# Patient Record
Sex: Female | Born: 1985 | Race: White | Hispanic: No | Marital: Single | Smoking: Current every day smoker
Health system: Southern US, Community
[De-identification: ages and names within clinical notes are randomized; demographics above are authoritative.]

## PROBLEM LIST (undated history)

## (undated) DIAGNOSIS — E669 Obesity, unspecified: Secondary | ICD-10-CM

## (undated) DIAGNOSIS — R7303 Prediabetes: Secondary | ICD-10-CM

## (undated) DIAGNOSIS — R945 Abnormal results of liver function studies: Secondary | ICD-10-CM

## (undated) DIAGNOSIS — G4700 Insomnia, unspecified: Secondary | ICD-10-CM

## (undated) DIAGNOSIS — R7989 Other specified abnormal findings of blood chemistry: Secondary | ICD-10-CM

## (undated) DIAGNOSIS — F988 Other specified behavioral and emotional disorders with onset usually occurring in childhood and adolescence: Secondary | ICD-10-CM

## (undated) DIAGNOSIS — I10 Essential (primary) hypertension: Secondary | ICD-10-CM

## (undated) DIAGNOSIS — F319 Bipolar disorder, unspecified: Secondary | ICD-10-CM

## (undated) DIAGNOSIS — F419 Anxiety disorder, unspecified: Secondary | ICD-10-CM

## (undated) HISTORY — DX: Abnormal results of liver function studies: R94.5

## (undated) HISTORY — DX: Prediabetes: R73.03

## (undated) HISTORY — DX: Other specified behavioral and emotional disorders with onset usually occurring in childhood and adolescence: F98.8

## (undated) HISTORY — DX: Insomnia, unspecified: G47.00

## (undated) HISTORY — DX: Essential (primary) hypertension: I10

## (undated) HISTORY — DX: Obesity, unspecified: E66.9

## (undated) HISTORY — DX: Bipolar disorder, unspecified: F31.9

## (undated) HISTORY — DX: Other specified abnormal findings of blood chemistry: R79.89

## (undated) HISTORY — DX: Anxiety disorder, unspecified: F41.9

---
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
## 2006-05-22 HISTORY — PX: CHOLECYSTECTOMY: SHX55

## 2014-05-22 HISTORY — PX: TUBAL LIGATION: SHX77

## 2019-06-13 ENCOUNTER — Encounter: Payer: Self-pay | Admitting: Gastroenterology

## 2019-07-01 ENCOUNTER — Telehealth: Payer: Self-pay | Admitting: Gastroenterology

## 2019-07-01 ENCOUNTER — Other Ambulatory Visit: Payer: Self-pay

## 2019-07-01 ENCOUNTER — Telehealth (INDEPENDENT_AMBULATORY_CARE_PROVIDER_SITE_OTHER): Payer: Medicaid Other | Admitting: Gastroenterology

## 2019-07-01 DIAGNOSIS — R7989 Other specified abnormal findings of blood chemistry: Secondary | ICD-10-CM

## 2019-07-01 DIAGNOSIS — R635 Abnormal weight gain: Secondary | ICD-10-CM

## 2019-07-01 DIAGNOSIS — E785 Hyperlipidemia, unspecified: Secondary | ICD-10-CM

## 2019-07-01 DIAGNOSIS — E119 Type 2 diabetes mellitus without complications: Secondary | ICD-10-CM

## 2019-07-01 DIAGNOSIS — F17200 Nicotine dependence, unspecified, uncomplicated: Secondary | ICD-10-CM

## 2019-07-01 DIAGNOSIS — R945 Abnormal results of liver function studies: Secondary | ICD-10-CM

## 2019-07-01 NOTE — Progress Notes (Signed)
Chief Complaint:   Referring Provider:  Simone Curia, MD      ASSESSMENT AND PLAN;   #1.  Abnormal LFTs likely d/t NAFLD. R/O other causes.  [place] 11/2016: Fatty liver. Neg acute viral hepatitis panel 2018. No ETOH #2.  Associated weight gain, DM2, HLD  Plan: - Check CBC, CMP, PT INR, autoimmune hepatitis panel (AMA, ASMA and anti-LKM), ANA, SPEP, iron studies, serum ceruloplasmin, A1AT, celiac screen and GGT. -Check anti-HAV total Ab and HBsAb.  If negative, would recommend vaccination for hepatitis A and B. -USE -Encouraged her to lose weight. -Avoid high calorie foods including fried foods, foods containing high fructose corn syrup like sodas.  -Start exercising like walking 30 min every day. -FU in 6-8 weeks once above WU is complete.  If unable to lose weight on her own, consider referral to bariatric surgery.    HPI:    Erica Leach is a 34 y.o. female  With history of longstanding abn LFTs. Most recent LFTs from 05/08/2019 AST 231, ALT 332, albumin 4.4.  Normal BUN/creatinine.  Total cholesterol 209, LDL 142.  Previous LFTs from 2018: AST 112, ALT 232. Neg acute hepatitis panel. US-fatty liver.   No history of itching, skin lesions, easy bruisability, intake of over-the-counter medications including diet pills, herbal medications, anabolic steroids or Tylenol.  There is no history of blood transfusions, IV drug use or family history of liver disease.  No jaundice dark urine or pale stools.  No history of alcohol abuse.  Has been progressively gaining weight.  She told me that she had gained 20 pounds over the last 1 month  No nausea, vomiting, heartburn, regurgitation, odynophagia or dysphagia. No melena or hematochezia. No abdominal pain.  Has alternating diarrhea and constipation.  Past Medical History:  Diagnosis Date  . Abnormal LFTs    with US showing fatty liver  . ADD (attention deficit disorder)   . Anxiety   . Bipolar disease, chronic (HCC)   . HTN  (hypertension)   . Insomnia   . Obesity   . Prediabetes     Past Surgical History:  Procedure Laterality Date  . CHOLECYSTECTOMY  2008  . TUBAL LIGATION  2016    Family History  Family history unknown: Yes    Social History   Tobacco Use  . Smoking status: Current Every Day Smoker    Packs/day: 1.00  . Smokeless tobacco: Never Used  Substance Use Topics  . Alcohol use: Not Currently    Comment: socially  . Drug use: Not Currently    Current Outpatient Medications  Medication Sig Dispense Refill  . amLODipine (NORVASC) 5 MG tablet Take 5 mg by mouth daily.    . clonazePAM (KLONOPIN) 0.5 MG tablet 1 tablet daily.    . cyclobenzaprine (FLEXERIL) 5 MG tablet Take 5 mg by mouth as needed for muscle spasms.    . hydrochlorothiazide (HYDRODIURIL) 25 MG tablet Take 25 mg by mouth daily.    . QUEtiapine (SEROQUEL) 50 MG tablet Take 150 mg by mouth daily.     No current facility-administered medications for this visit.    Not on File  Review of Systems:  Constitutional: Denies fever, chills, diaphoresis, appetite change and has fatigue.  HEENT: Denies photophobia, eye pain, redness, hearing loss, ear pain, congestion, sore throat, rhinorrhea, sneezing, mouth sores, neck pain, neck stiffness and tinnitus.   Respiratory: Denies SOB, DOE, cough, chest tightness,  and wheezing.   Cardiovascular: Denies chest pain, palpitations  and leg swelling.  Genitourinary: Denies dysuria, urgency, frequency, hematuria, flank pain and difficulty urinating.  Musculoskeletal: has myalgias, back pain, right knee joint swelling, arthralgias and gait problem.  Skin: No rash.  Neurological: Denies dizziness, seizures, syncope, weakness, light-headedness, numbness and headaches.  Hematological: Denies adenopathy. Easy bruising, personal or family bleeding history  Psychiatric/Behavioral: Has anxiety or depression     Physical Exam:    There were no vitals taken for this visit. Wt Readings from  Last 3 Encounters:  No data found for Wt   Not examined since it was a televisit  I connected with  Erica Leach on 07/01/19 by a telephone (video-did not work)telemedicine application and verified that I am speaking with the correct person using two identifiers.   I discussed the limitations of evaluation and management by telemedicine. The patient expressed understanding and agreed to proceed.   Carmell Austria, MD [facility]

## 2019-07-01 NOTE — Patient Instructions (Signed)
If you are age 36 or older, your body mass index should be between 23-30. Your There is no height or weight on file to calculate BMI. If this is out of the aforementioned range listed, please consider follow up with your Primary Care Provider.  If you are age 26 or younger, your body mass index should be between 19-25. Your There is no height or weight on file to calculate BMI. If this is out of the aformentioned range listed, please consider follow up with your Primary Care Provider.   You have been scheduled for an abdominal ultrasound with elastography at [facility] (1st floor). Your appointment is scheduled for 07/07/19 at 9:30am. Please arrive 15 minutes prior to your scheduled appointment for registration purposes. Make certain not to have anything to eat or drink 6 hours prior to your procedure. Should you need to reschedule your appointment, you may contact radiology at [phone].  Liver Elastography Various chronic liver diseases such as hepatitis B, C, and fatty liver disease can lead to tissue damage and subsequent scar tissue formation. As the scar tissue accumulates, the liver loses some of its elasticity and becomes stiffer. Liver elastography involves the use of a surface ultrasound probe that delivers a low frequency pulse or shear wave to a small volume of liver tissue under the rib cage. The transmission of the sound wave is completely painless. How Is a Liver Elastography Performed? The liver is located in the right upper abdomen under the rib cage. Patients are asked to lie flat on an examination table. A technician places the FibroScan probe between the ribs on the right side of the lower chest wall. A series of 10 painless pulses are then applied to the liver. The results are recorded on the equipment and an overall liver stiffness score is generated. This score is then interpreted by a qualified physician to predict the likelihood of advanced fibrosis or cirrhosis.   Patients are asked to wear loose clothing and should not consume any liquids or solids for a minimum of 4 hours before the test to increase the likelihood of obtaining reliable test results. The scan will take 10 to 15 minutes to complete, but patients should plan on being available for 30 minutes to allow time for preparation   Please go to the lab at [facility] ([address] [place] You will need to go to level "B", you do not need an appointment for this. Hours available are 7:30 am - 4:30 pm.   Follow up 6-8 weeks  Thank you,  Dr. Lynann Bologna

## 2019-07-07 ENCOUNTER — Ambulatory Visit (HOSPITAL_COMMUNITY): Payer: Medicaid Other

## 2019-07-08 ENCOUNTER — Telehealth: Payer: Self-pay | Admitting: Gastroenterology

## 2019-07-08 NOTE — Telephone Encounter (Signed)
Pt requested to reschedule [place].  She stated that she was taking care of her daughter and missed the appt.

## 2019-07-08 NOTE — Telephone Encounter (Signed)
Called and spoke with patient- patient advised of phone number to call [phone] to reschedule her [place] as this office does not capability of scheduling appts for this test; Patient verbalized understanding of information/instructions; Patient advised to call back to the office at [phone] should questions/concerns arise;

## 2019-07-16 ENCOUNTER — Other Ambulatory Visit: Payer: Self-pay

## 2019-07-16 ENCOUNTER — Ambulatory Visit (HOSPITAL_COMMUNITY)
Admission: RE | Admit: 2019-07-16 | Discharge: 2019-07-16 | Disposition: A | Discharge status: Home / Self Care | Payer: Medicaid Other | Source: Ambulatory Visit | Attending: Gastroenterology | Admitting: Gastroenterology

## 2019-07-16 DIAGNOSIS — R7989 Other specified abnormal findings of blood chemistry: Secondary | ICD-10-CM

## 2019-07-16 DIAGNOSIS — R945 Abnormal results of liver function studies: Secondary | ICD-10-CM | POA: Diagnosis present

## 2019-07-18 ENCOUNTER — Other Ambulatory Visit (INDEPENDENT_AMBULATORY_CARE_PROVIDER_SITE_OTHER): Payer: Medicaid Other

## 2019-07-18 DIAGNOSIS — R7989 Other specified abnormal findings of blood chemistry: Secondary | ICD-10-CM

## 2019-07-18 DIAGNOSIS — R945 Abnormal results of liver function studies: Secondary | ICD-10-CM | POA: Diagnosis not present

## 2019-07-18 LAB — CBC WITH DIFFERENTIAL/PLATELET
Basophils Absolute: 0.1 10*3/uL (ref 0.0–0.1)
Basophils Relative: 1.5 % (ref 0.0–3.0)
Eosinophils Absolute: 0.2 10*3/uL (ref 0.0–0.7)
Eosinophils Relative: 2.2 % (ref 0.0–5.0)
HCT: 44.7 % (ref 36.0–46.0)
Hemoglobin: 15.6 g/dL — ABNORMAL HIGH (ref 12.0–15.0)
Lymphocytes Relative: 27.8 % (ref 12.0–46.0)
Lymphs Abs: 2.7 10*3/uL (ref 0.7–4.0)
MCHC: 34.8 g/dL (ref 30.0–36.0)
MCV: 93.1 fl (ref 78.0–100.0)
Monocytes Absolute: 0.7 10*3/uL (ref 0.1–1.0)
Monocytes Relative: 7.3 % (ref 3.0–12.0)
Neutro Abs: 5.9 10*3/uL (ref 1.4–7.7)
Neutrophils Relative %: 61.2 % (ref 43.0–77.0)
Platelets: 238 10*3/uL (ref 150.0–400.0)
RBC: 4.8 Mil/uL (ref 3.87–5.11)
RDW: 13.9 % (ref 11.5–15.5)
WBC: 9.6 10*3/uL (ref 4.0–10.5)

## 2019-07-18 LAB — COMPREHENSIVE METABOLIC PANEL
ALT: 141 U/L — ABNORMAL HIGH (ref 0–35)
AST: 68 U/L — ABNORMAL HIGH (ref 0–37)
Albumin: 4.5 g/dL (ref 3.5–5.2)
Alkaline Phosphatase: 72 U/L (ref 39–117)
BUN: 8 mg/dL (ref 6–23)
CO2: 25 mEq/L (ref 19–32)
Calcium: 9.5 mg/dL (ref 8.4–10.5)
Chloride: 107 mEq/L (ref 96–112)
Creatinine, Ser: 0.59 mg/dL (ref 0.40–1.20)
GFR: 115.71 mL/min (ref >60.00)
Glucose, Bld: 91 mg/dL (ref 70–99)
Potassium: 4.2 mEq/L (ref 3.5–5.1)
Sodium: 138 mEq/L (ref 135–145)
Total Bilirubin: 0.6 mg/dL (ref 0.2–1.2)
Total Protein: 7.3 g/dL (ref 6.0–8.3)

## 2019-07-18 LAB — PROTIME-INR
INR: 1 ratio (ref 0.8–1.0)
Prothrombin Time: 11.2 s (ref 9.6–13.1)

## 2019-07-18 LAB — GAMMA GT: GGT: 52 U/L — ABNORMAL HIGH (ref 7–51)

## 2019-07-22 ENCOUNTER — Telehealth: Payer: Self-pay | Admitting: Gastroenterology

## 2019-07-22 LAB — CELIAC PANEL 10
Antigliadin Abs, IgA: 4 units (ref 0–19)
Endomysial IgA: NEGATIVE
Gliadin IgG: 2 units (ref 0–19)
IgA/Immunoglobulin A, Serum: 145 mg/dL (ref 87–352)
Tissue Transglut Ab: <2 U/mL (ref 0–5)
Transglutaminase IgA: <2 U/mL (ref 0–3)

## 2019-07-22 NOTE — Telephone Encounter (Signed)
Patient returned your call about results, please call patient one more time.   

## 2019-07-22 NOTE — Telephone Encounter (Signed)
Please see result note for further documentation concerning this patient

## 2019-07-23 LAB — HEPATITIS B SURFACE ANTIBODY,QUALITATIVE: Hep B S Ab: REACTIVE — AB

## 2019-07-23 LAB — CERULOPLASMIN: Ceruloplasmin: 29 mg/dL (ref 18–53)

## 2019-07-23 LAB — PROTEIN ELECTROPHORESIS, SERUM
Albumin ELP: 4.6 g/dL (ref 3.8–4.8)
Alpha 1: 0.3 g/dL (ref 0.2–0.3)
Alpha 2: 0.6 g/dL (ref 0.5–0.9)
Beta 2: 0.4 g/dL (ref 0.2–0.5)
Beta Globulin: 0.5 g/dL (ref 0.4–0.6)
Gamma Globulin: 0.8 g/dL (ref 0.8–1.7)
Total Protein: 7.2 g/dL (ref 6.1–8.1)

## 2019-07-23 LAB — IRON, TOTAL/TOTAL IRON BINDING CAP
%SAT: 26 % (calc) (ref 16–45)
Iron: 100 ug/dL (ref 40–190)
TIBC: 380 mcg/dL (calc) (ref 250–450)

## 2019-07-23 LAB — ALPHA-1-ANTITRYPSIN: A-1 Antitrypsin, Ser: 116 mg/dL (ref 83–199)

## 2019-07-23 LAB — MITOCHONDRIAL ANTIBODIES: Mitochondrial M2 Ab, IgG: <=20 U

## 2019-07-23 LAB — ANA: Anti Nuclear Antibody (ANA): NEGATIVE

## 2019-07-23 LAB — HEPATITIS A ANTIBODY, TOTAL: Hepatitis A AB,Total: NONREACTIVE

## 2019-07-23 LAB — ANTI-MICROSOMAL ANTIBODY LIVER / KIDNEY: LKM1 Ab: <=20 U (ref <=20.0)

## 2019-08-13 ENCOUNTER — Ambulatory Visit: Payer: Medicaid Other | Admitting: Gastroenterology

## 2020-11-21 ENCOUNTER — Emergency Department (HOSPITAL_COMMUNITY)
Admission: EM | Admit: 2020-11-21 | Discharge: 2020-11-22 | Disposition: A | Discharge status: Home / Self Care | Payer: Medicaid Other | Attending: Emergency Medicine | Admitting: Emergency Medicine

## 2020-11-21 ENCOUNTER — Emergency Department (HOSPITAL_BASED_OUTPATIENT_CLINIC_OR_DEPARTMENT_OTHER): Payer: Medicaid Other

## 2020-11-21 ENCOUNTER — Emergency Department (HOSPITAL_COMMUNITY): Payer: Medicaid Other

## 2020-11-21 DIAGNOSIS — I10 Essential (primary) hypertension: Secondary | ICD-10-CM | POA: Diagnosis not present

## 2020-11-21 DIAGNOSIS — Y900 Blood alcohol level of less than 20 mg/100 ml: Secondary | ICD-10-CM | POA: Diagnosis not present

## 2020-11-21 DIAGNOSIS — R413 Other amnesia: Secondary | ICD-10-CM | POA: Insufficient documentation

## 2020-11-21 DIAGNOSIS — Z79899 Other long term (current) drug therapy: Secondary | ICD-10-CM | POA: Diagnosis not present

## 2020-11-21 DIAGNOSIS — F17210 Nicotine dependence, cigarettes, uncomplicated: Secondary | ICD-10-CM | POA: Diagnosis not present

## 2020-11-21 LAB — CBC WITH DIFFERENTIAL/PLATELET
Abs Immature Granulocytes: 0.04 10*3/uL (ref 0.00–0.07)
Basophils Absolute: 0.1 10*3/uL (ref 0.0–0.1)
Basophils Relative: 1 %
Eosinophils Absolute: 0 10*3/uL (ref 0.0–0.5)
Eosinophils Relative: 0 %
HCT: 42.8 % (ref 36.0–46.0)
Hemoglobin: 14.2 g/dL (ref 12.0–15.0)
Immature Granulocytes: 0 %
Lymphocytes Relative: 21 %
Lymphs Abs: 2.3 10*3/uL (ref 0.7–4.0)
MCH: 32.5 pg (ref 26.0–34.0)
MCHC: 33.2 g/dL (ref 30.0–36.0)
MCV: 97.9 fL (ref 80.0–100.0)
Monocytes Absolute: 0.9 10*3/uL (ref 0.1–1.0)
Monocytes Relative: 8 %
Neutro Abs: 7.4 10*3/uL (ref 1.7–7.7)
Neutrophils Relative %: 70 %
Platelets: 284 10*3/uL (ref 150–400)
RBC: 4.37 MIL/uL (ref 3.87–5.11)
RDW: 13.2 % (ref 11.5–15.5)
WBC: 10.6 10*3/uL — ABNORMAL HIGH (ref 4.0–10.5)
nRBC: 0 % (ref 0.0–0.2)

## 2020-11-21 LAB — COMPREHENSIVE METABOLIC PANEL
ALT: 17 U/L (ref 0–44)
AST: 16 U/L (ref 15–41)
Albumin: 3.8 g/dL (ref 3.5–5.0)
Alkaline Phosphatase: 52 U/L (ref 38–126)
Anion gap: 7 (ref 5–15)
BUN: 8 mg/dL (ref 6–20)
CO2: 23 mmol/L (ref 22–32)
Calcium: 9.4 mg/dL (ref 8.9–10.3)
Chloride: 107 mmol/L (ref 98–111)
Creatinine, Ser: 0.6 mg/dL (ref 0.44–1.00)
GFR, Estimated: >60 mL/min (ref >60)
Glucose, Bld: 90 mg/dL (ref 70–99)
Potassium: 3.6 mmol/L (ref 3.5–5.1)
Sodium: 137 mmol/L (ref 135–145)
Total Bilirubin: 1 mg/dL (ref 0.3–1.2)
Total Protein: 6.7 g/dL (ref 6.5–8.1)

## 2020-11-21 LAB — I-STAT BETA HCG BLOOD, ED (MC, WL, AP ONLY): I-stat hCG, quantitative: <5 m[IU]/mL (ref <5)

## 2020-11-21 LAB — ETHANOL: Alcohol, Ethyl (B): <10 mg/dL (ref <10)
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
## 2020-11-22 LAB — RAPID URINE DRUG SCREEN, HOSP PERFORMED
Amphetamines: NOT DETECTED
Barbiturates: NOT DETECTED
Benzodiazepines: POSITIVE — AB
Cocaine: NOT DETECTED
Opiates: NOT DETECTED
Tetrahydrocannabinol: NOT DETECTED

## 2020-11-22 NOTE — ED Notes (Signed)
TTS in progress 
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
## 2020-11-22 NOTE — Discharge Instructions (Addendum)
Follow up with both neurology as well as continued evaluation by psychiatry in the outpatient setting. You have been given referrals in the [facility] system. It is your choice whether you want to be seen in [place] or [place].   If you develop new symptoms of concern, return to the emergency department at any time.

## 2020-11-22 NOTE — ED Notes (Signed)
E-signature pad unavailable at time of pt discharge. This RN discussed discharge materials with pt and answered all pt questions. Pt stated understanding of discharge material. ? ?

## 2020-11-22 NOTE — ED Notes (Signed)
RN attempted to call patient's ride x3 to confirm that they are picking up pt from lobby. No answer.

## 2021-03-27 IMAGING — US US ABDOMEN COMPLETE W/ ELASTOGRAPHY
2 series · 12 of 25 positions shown · non-contrast
Comparison: Ultrasound abdomen 11/27/2016

CLINICAL DATA: Abnormal LFTs.  Status post cholecystectomy.

EXAM:
ULTRASOUND ABDOMEN
ULTRASOUND HEPATIC ELASTOGRAPHY
TECHNIQUE: Sonography of the upper abdomen was performed. In addition,
ultrasound elastography evaluation of the liver was performed. A
region of interest was placed within the right lobe of the liver.
Following application of a compressive sonographic pulse, tissue
compressibility was assessed. Multiple assessments were performed at
the selected site. Median tissue compressibility was determined.
Previously, hepatic stiffness was assessed by shear wave velocity.
Based on recently published [employer] in Ul[employer]ound
consensus article, reporting is now recommended to be performed in
the SI units of pressure (kiloPascals) representing hepatic
stiffness/elasticity. The obtained result is compared to the
published reference standards. (cACLD = compensated Advanced Chronic
Liver Disease)

[Series 1: us abdomen complete w/ elastography · 8 of 69 slices shown (1 of 2)]
[im 5/69]
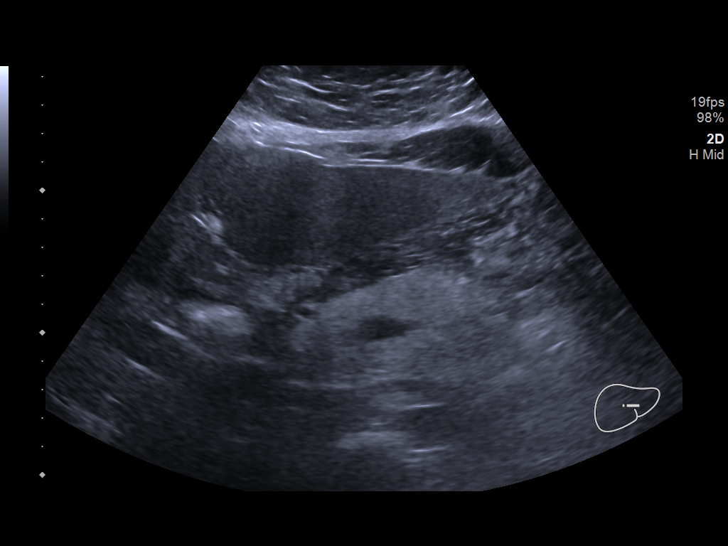
[im 13/69]
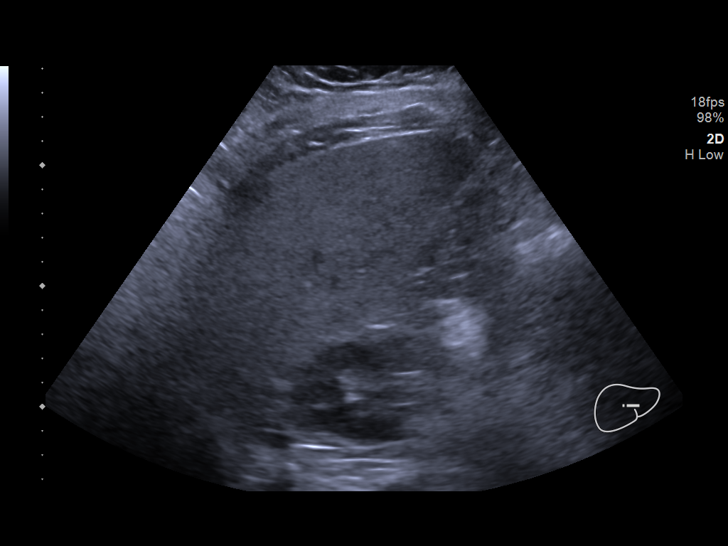
[im 22/69]
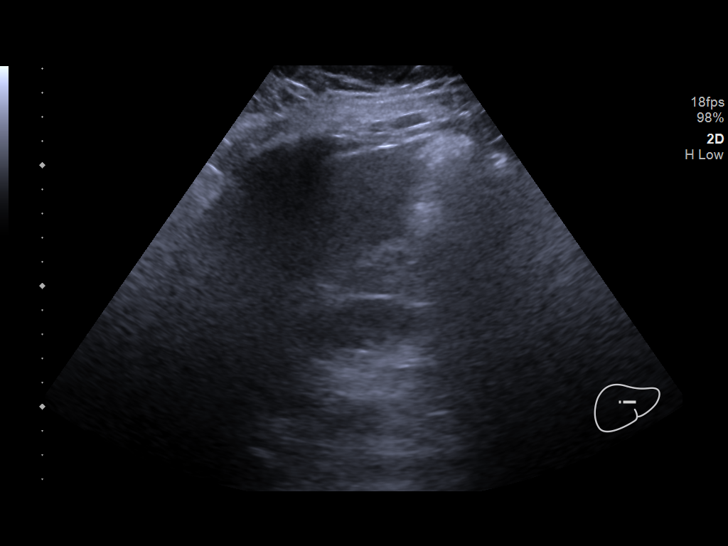
[im 30/69]
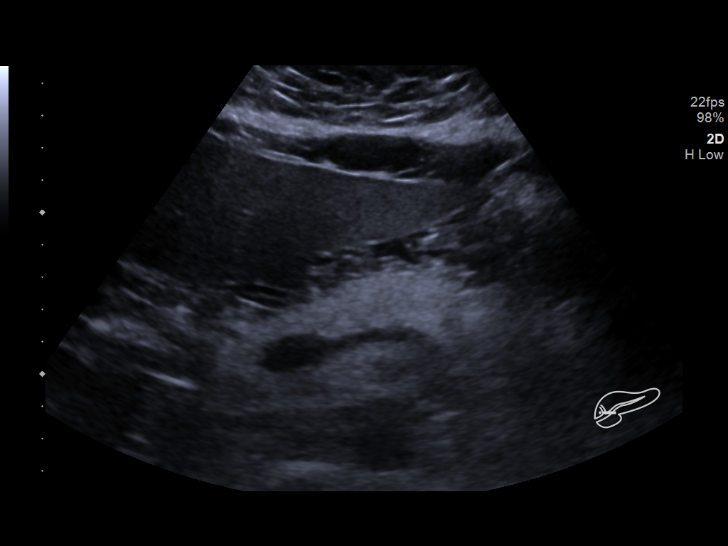
[im 39/69]
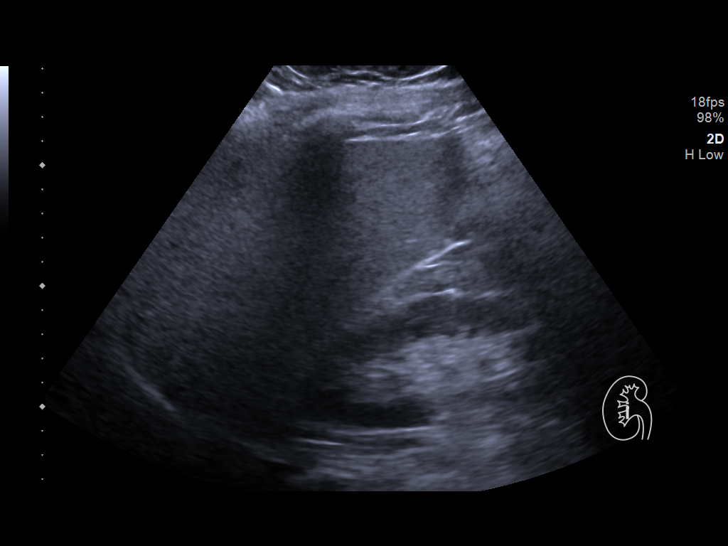
[im 47/69]
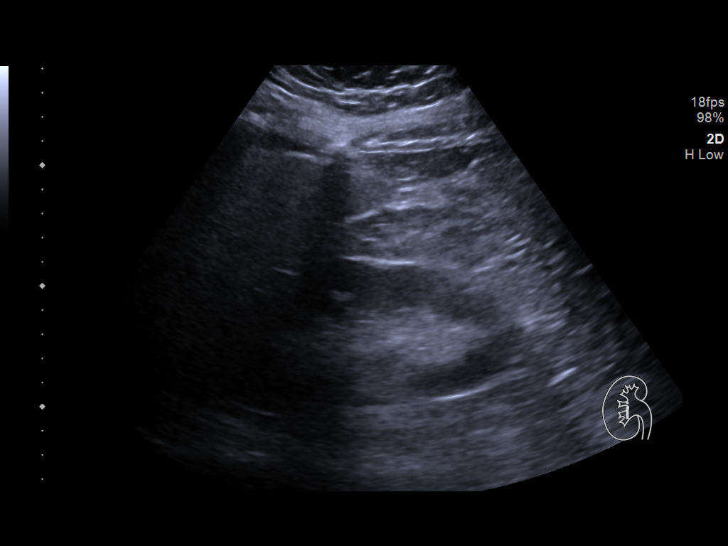
[im 56/69]
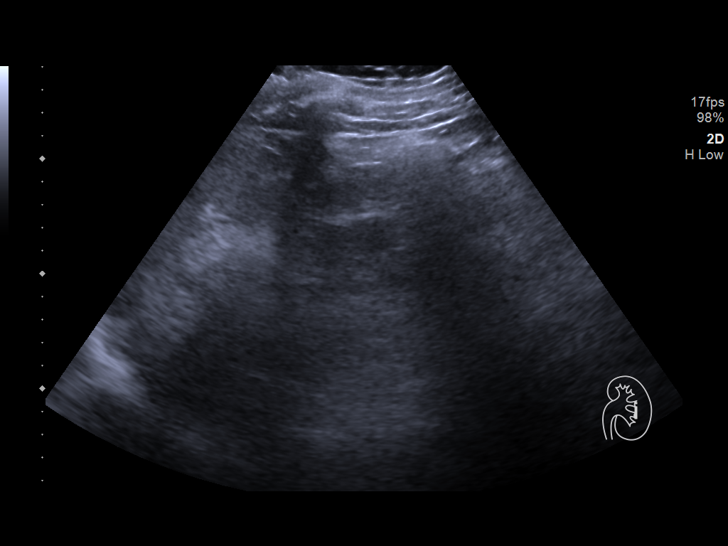
[im 64/69]
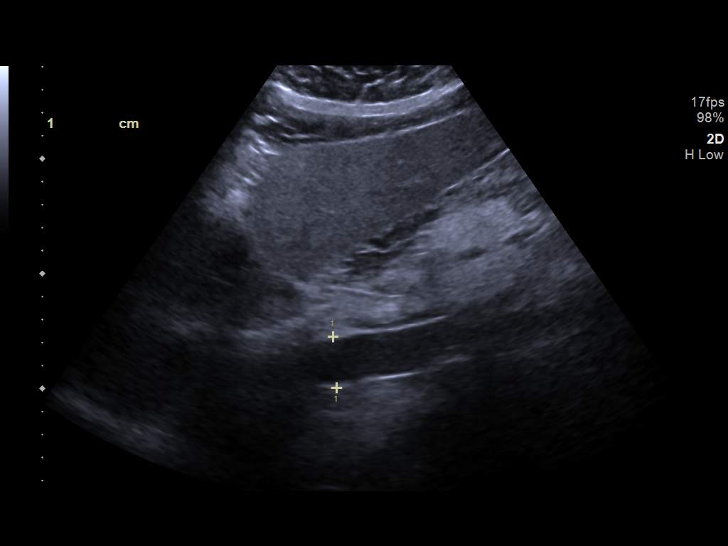

[Series 2: us abdomen complete w/ elastography · 4 of 33 slices shown (2 of 2)]
[im 1/33]
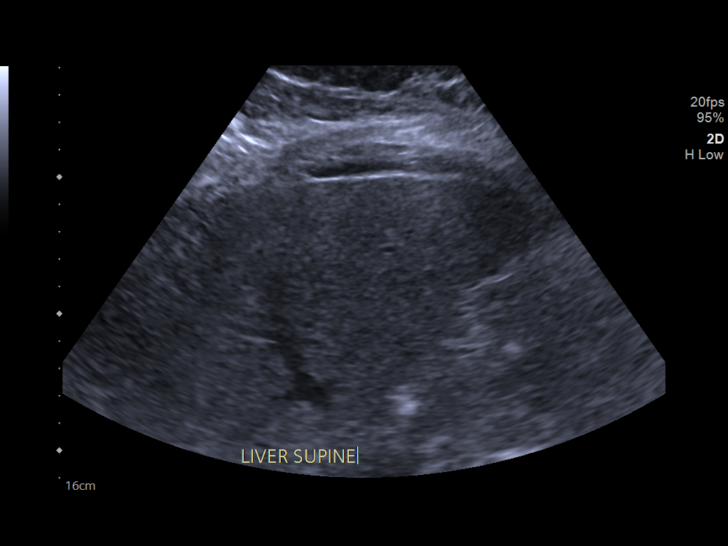
[im 10/33]
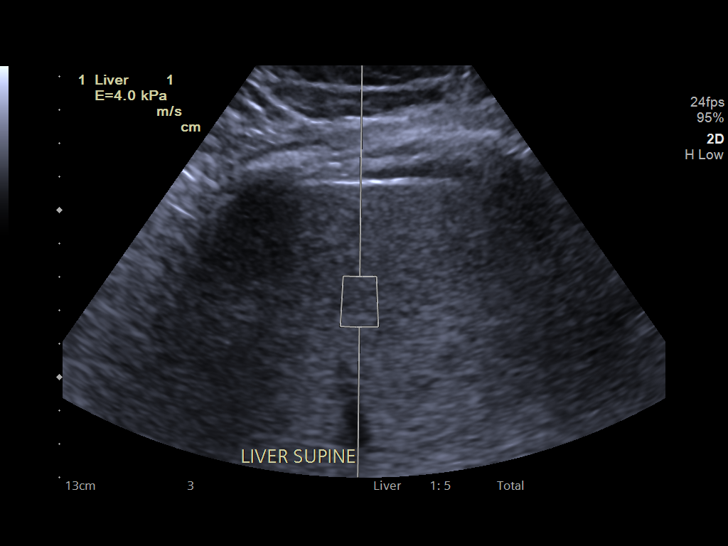
[im 19/33]
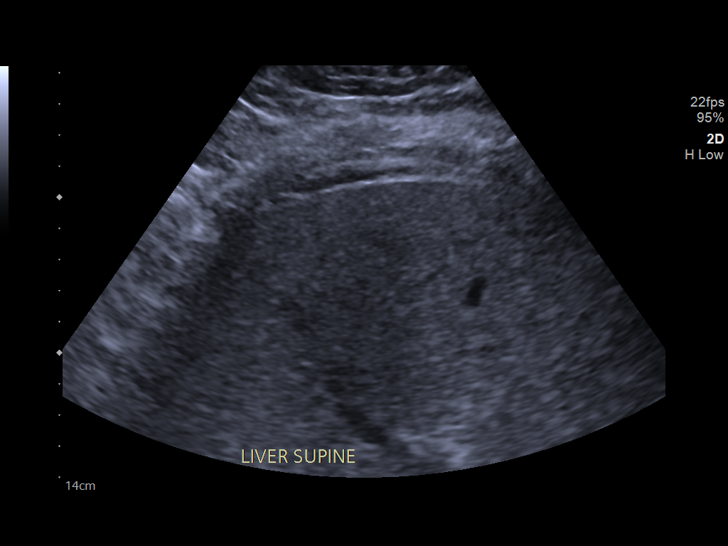
[im 28/33]
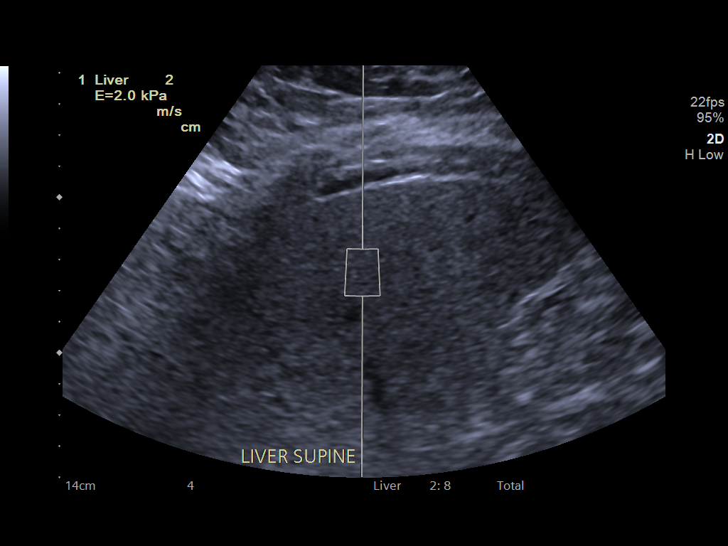

[12 of 25 positions shown; findings below may reference images not displayed]

FINDINGS: ULTRASOUND ABDOMEN

Gallbladder: Status post cholecystectomy.

Common bile duct: Diameter: 4 mm

Liver: No focal lesion identified. Diffusely increased parenchymal
echogenicity. Portal vein is patent on color Doppler imaging with
normal direction of blood flow towards the liver.

IVC: No abnormality visualized.

Pancreas: Visualized portion unremarkable.

Spleen: Size and appearance within normal limits.

Right Kidney: Length: 13.6 cm. Echogenicity within normal limits. No
mass or hydronephrosis visualized.

Left Kidney: Length: 13.3 cm. Echogenicity within normal limits. No
mass or hydronephrosis visualized.

Abdominal aorta: No aneurysm visualized.

Other findings: None.

ULTRASOUND HEPATIC ELASTOGRAPHY

Device: Siemens Helix VTQ

Patient position: Supine

Transducer 5 C1

Number of measurements: 10

Hepatic segment:  8

Median kPa:

IQR:

IQR/Median kPa ratio:

Data quality: IQR/Median kPa ratio of 0.3 or greater indicates
reduced accuracy

Diagnostic category:  < or = 5 kPa: high probability of being normal
IMPRESSION: ULTRASOUND ABDOMEN:

1. No acute abnormality.
2. Echogenic liver compatible with hepatic steatosis.
3. Cholecystectomy.

ULTRASOUND HEPATIC ELASTOGRAPHY:

Median kPa:

Diagnostic category:  < or = 5 kPa: high probability of being normal

The use of hepatic elastography is applicable to patients with viral
hepatitis and non-alcoholic fatty liver disease. At this time, there
is insufficient data for the referenced cut-off values and use in
other causes of liver disease, including alcoholic liver disease.
Patients, however, may be assessed by elastography and serve as
their own reference standard/baseline.

In patients with non-alcoholic liver disease, the values suggesting
compensated advanced chronic liver disease (cACLD) may be lower, and
patients may need additional testing with elasticity results of [DATE]
kPa.

Please note that abnormal hepatic elasticity and shear wave
velocities may also be identified in clinical settings other than
with hepatic fibrosis, such as: acute hepatitis, elevated right
heart and central venous pressures including use of beta blockers,
Sobahle disease (Judelin), infiltrative processes such as
mastocytosis/amyloidosis/infiltrative tumor/lymphoma, extrahepatic
cholestasis, with hyperemia in the post-prandial state, and with
liver transplantation. Correlation with patient history, laboratory
data, and clinical condition recommended.

Diagnostic Categories:

< or =5 kPa: high probability of being normal

< or =9 kPa: in the absence of other known clinical signs, rules [DATE] kPa and ?13 kPa: suggestive of cACLD, but needs further testing

>13 kPa: highly suggestive of cACLD

> or =17 kPa: highly suggestive of cACLD with an increased
probability of clinically significant portal hypertension

## 2022-08-03 IMAGING — CT CT HEAD W/O CM
4 series · 17 of 47 positions shown, 19 images · non-contrast
Comparison: 11/21/2020, Lovline Ugochukwu

CLINICAL DATA: Memory loss for 2 hours.  Unknown cause.

EXAM:
CT HEAD WITHOUT CONTRAST
TECHNIQUE: Contiguous axial images were obtained from the base of the skull
through the vertex without intravenous contrast.

[Series 3: head wo · axial · 0.42mm/px · z∈[+821,+941]mm · 7 of 33 slices shown, 9 images]
[im 5/33  brain]
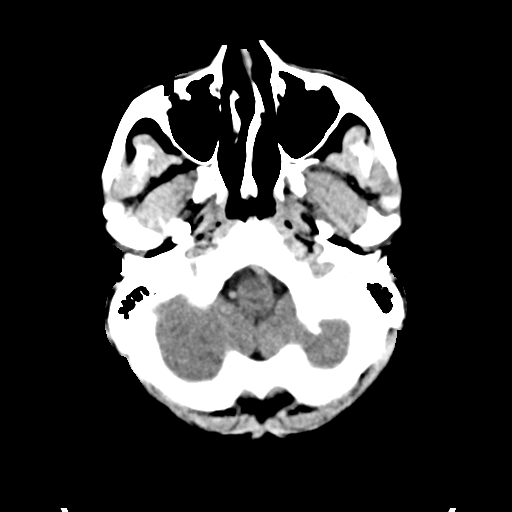
[im 5/33  bone]
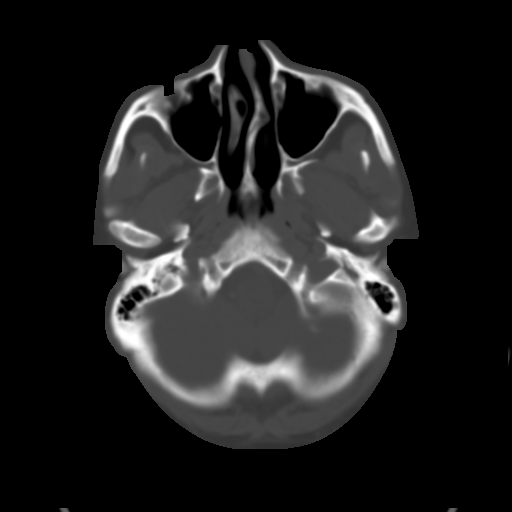
[im 9/33  brain]
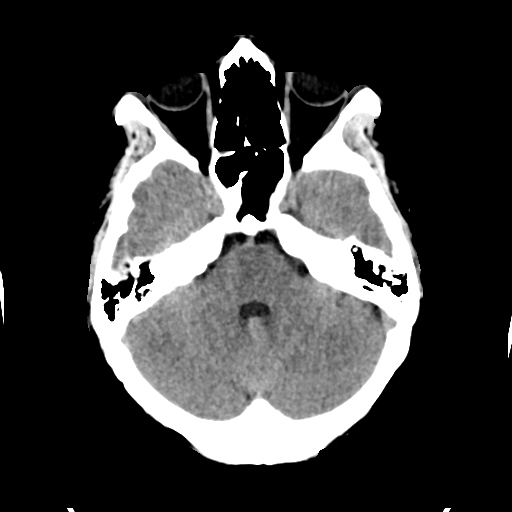
[im 13/33  brain]
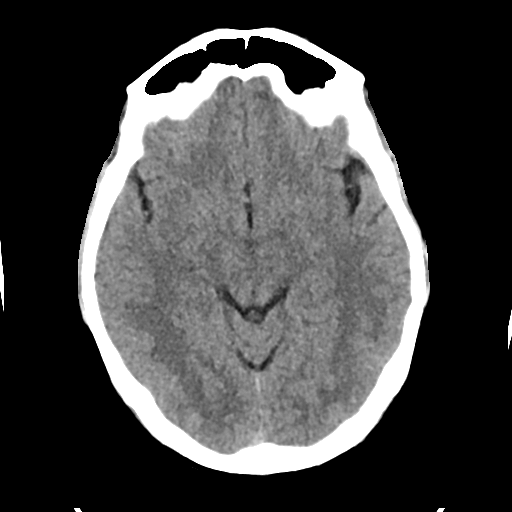
[im 17/33  brain]
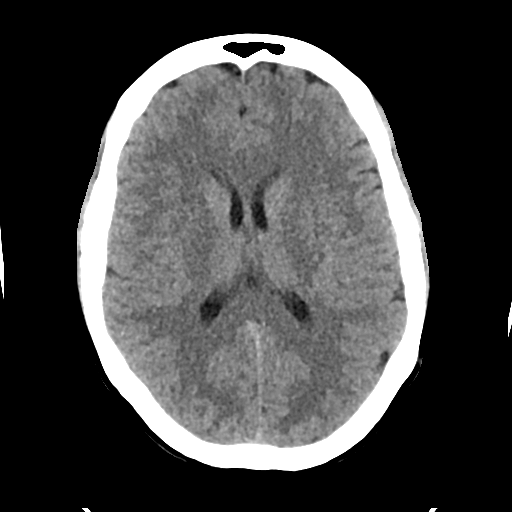
[im 21/33  brain]
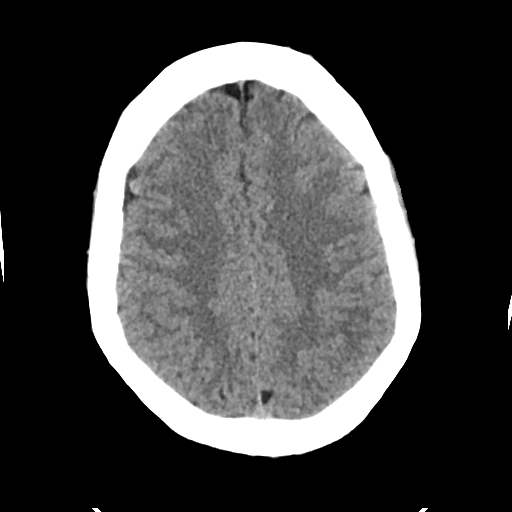
[im 21/33  bone]
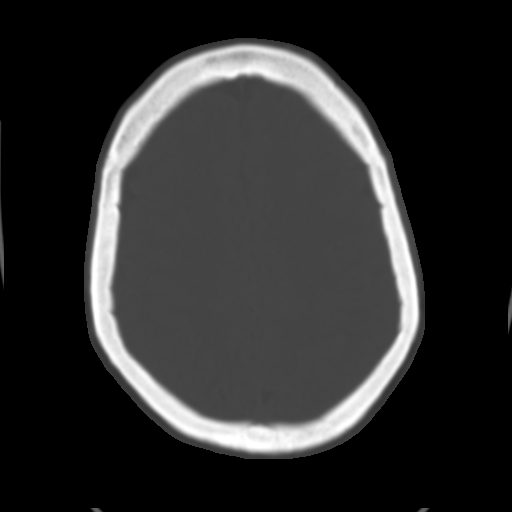
[im 25/33  brain]
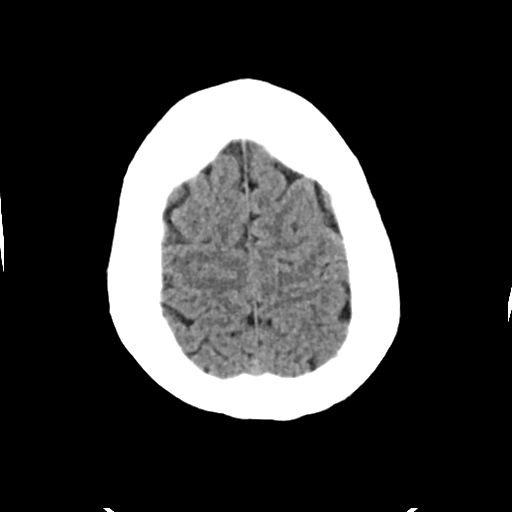
[im 29/33  brain]
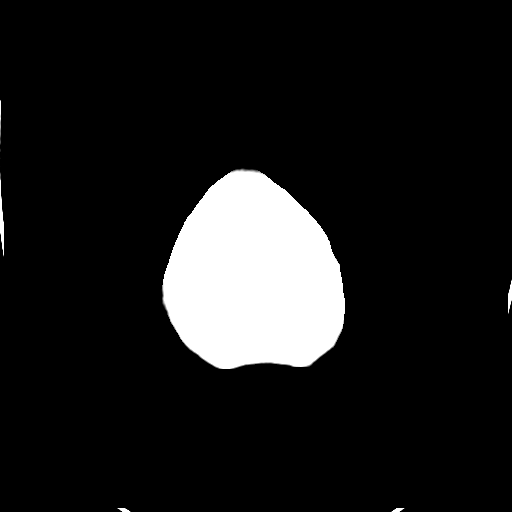

[Series 4: head bone · axial · 0.42mm/px · z∈[+817,+873]mm · 4 of 83 slices shown]
[im 9/83  bone]
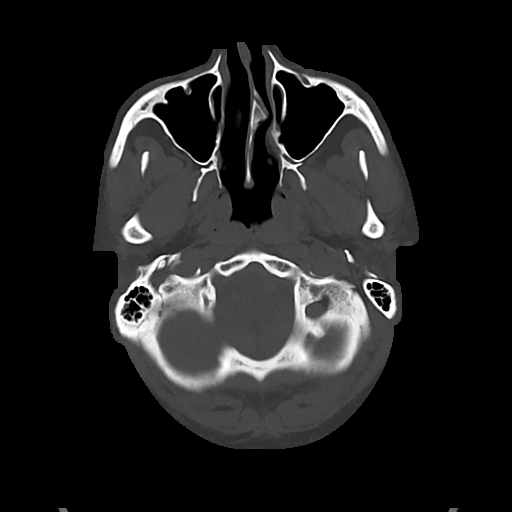
[im 17/83  bone]
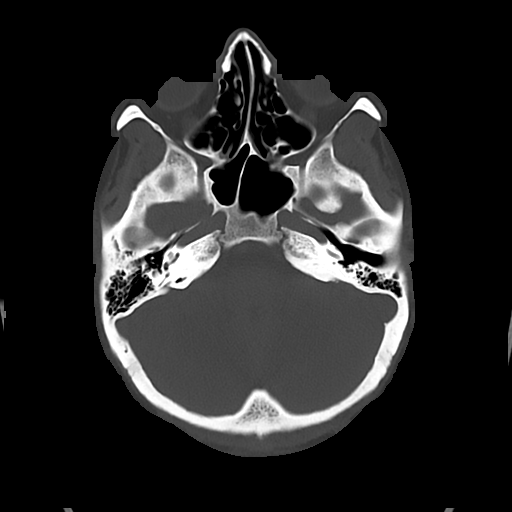
[im 25/83  bone]
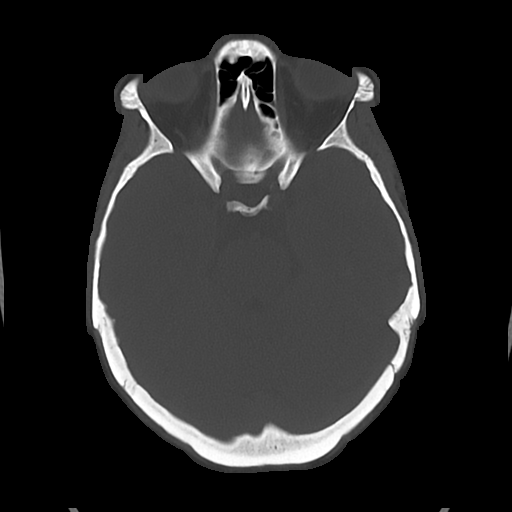
[im 37/83  bone]
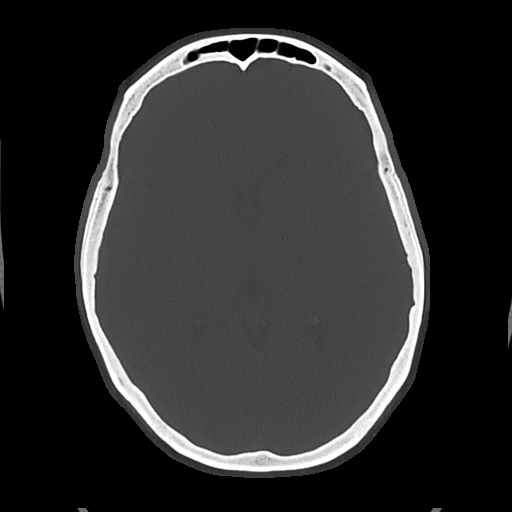

[Series 5: cor soft · coronal · 0.31mm/px · 3 of 70 slices shown]
[im 24/70  brain]
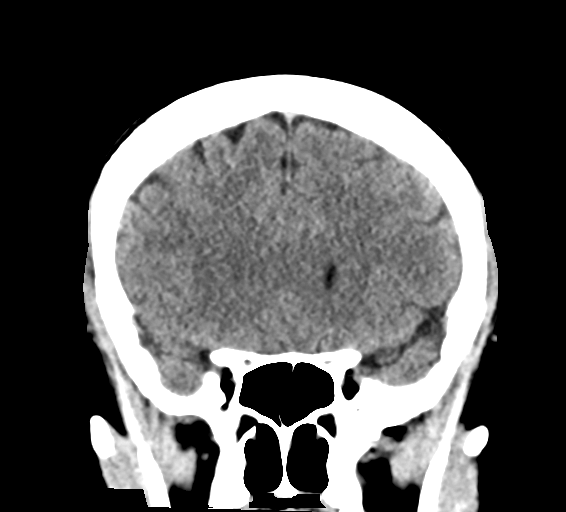
[im 31/70  brain]
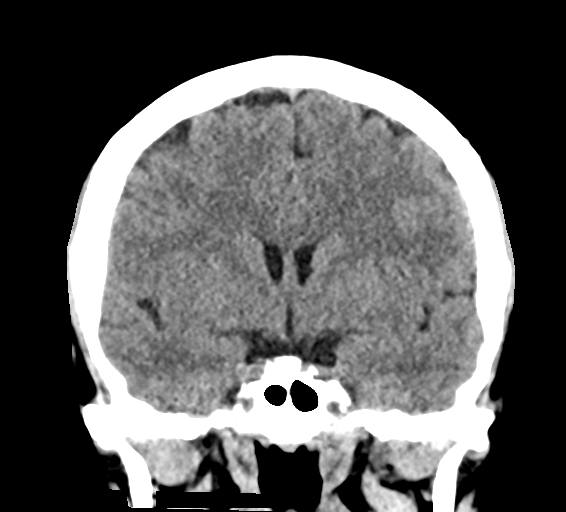
[im 39/70  brain]
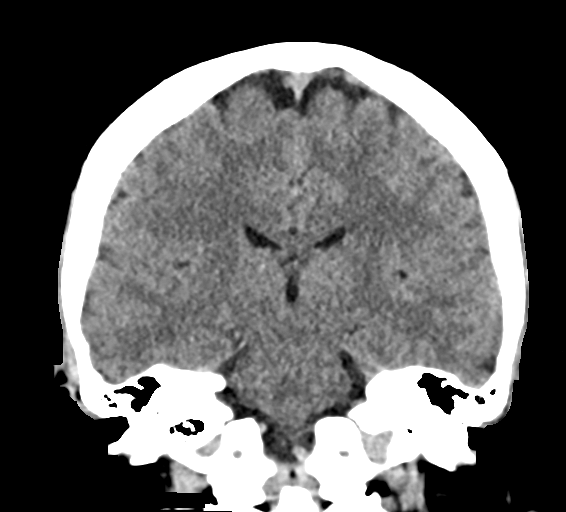

[Series 6: sag soft · sagittal · 0.32mm/px · 3 of 60 slices shown]
[im 20/60  brain]
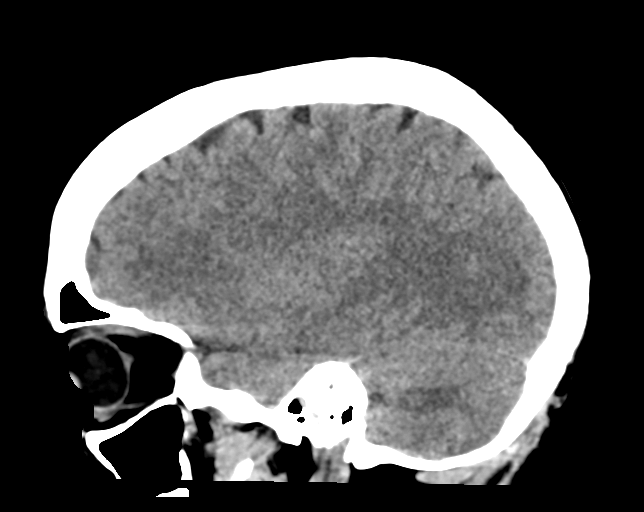
[im 30/60  brain]
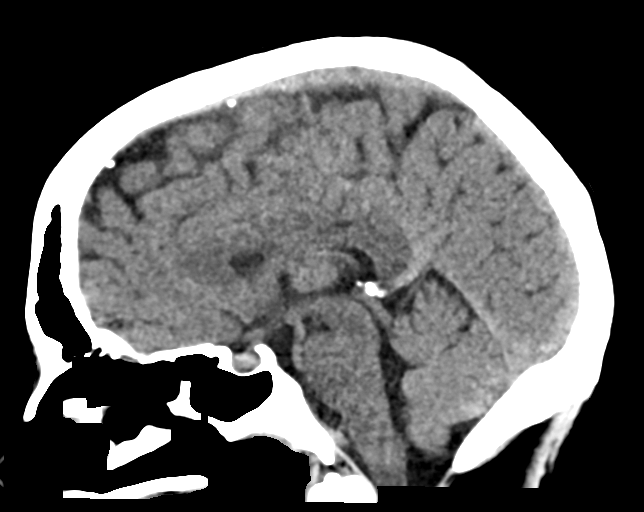
[im 40/60  brain]
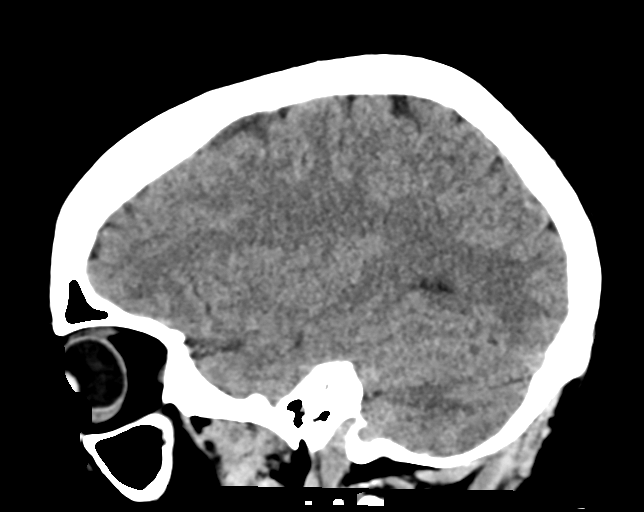

[17 of 47 positions shown; findings below may reference images not displayed]

FINDINGS: Brain: No evidence of acute infarction, hemorrhage, hydrocephalus,
extra-axial collection or mass lesion/mass effect.

Vascular: No hyperdense vessel or unexpected calcification.

Skull: Calvarium appears intact.

Sinuses/Orbits: Paranasal sinuses and mastoid air cells are clear.

Other: No significant change since previous study.
IMPRESSION: No acute intracranial abnormalities.

## 2023-12-21 ENCOUNTER — Telehealth: Payer: Self-pay

## 2023-12-21 NOTE — Telephone Encounter (Signed)
 called 12-21-23 provider out of office from 12pm to 3pm

## 2023-12-26 ENCOUNTER — Institutional Professional Consult (permissible substitution)
